# Patient Record
Sex: Female | Born: 1985 | Race: White | Hispanic: No | Marital: Single
Health system: Southern US, Community
[De-identification: ages and names within clinical notes are randomized; demographics above are authoritative.]

## PROBLEM LIST (undated history)

## (undated) DIAGNOSIS — F419 Anxiety disorder, unspecified: Secondary | ICD-10-CM

## (undated) HISTORY — PX: TONSILLECTOMY: SUR1361

---

## 2015-02-27 DIAGNOSIS — R630 Anorexia: Secondary | ICD-10-CM | POA: Insufficient documentation

## 2015-02-27 DIAGNOSIS — R112 Nausea with vomiting, unspecified: Secondary | ICD-10-CM | POA: Insufficient documentation

## 2015-02-27 DIAGNOSIS — F419 Anxiety disorder, unspecified: Secondary | ICD-10-CM | POA: Insufficient documentation

## 2015-02-28 ENCOUNTER — Emergency Department (HOSPITAL_BASED_OUTPATIENT_CLINIC_OR_DEPARTMENT_OTHER): Payer: Self-pay

## 2015-02-28 ENCOUNTER — Emergency Department (HOSPITAL_BASED_OUTPATIENT_CLINIC_OR_DEPARTMENT_OTHER)
Admission: EM | Admit: 2015-02-28 | Discharge: 2015-02-28 | Disposition: A | Discharge status: Home / Self Care | Payer: Self-pay | Attending: Emergency Medicine | Admitting: Emergency Medicine

## 2015-02-28 ENCOUNTER — Encounter (HOSPITAL_BASED_OUTPATIENT_CLINIC_OR_DEPARTMENT_OTHER): Payer: Self-pay | Admitting: Emergency Medicine

## 2015-02-28 DIAGNOSIS — R112 Nausea with vomiting, unspecified: Secondary | ICD-10-CM

## 2015-02-28 HISTORY — DX: Anxiety disorder, unspecified: F41.9

## 2015-02-28 LAB — CBC WITH DIFFERENTIAL/PLATELET
BASOS ABS: 0 10*3/uL (ref 0.0–0.1)
Basophils Relative: 0 % (ref 0–1)
EOS PCT: 0 % (ref 0–5)
Eosinophils Absolute: 0 10*3/uL (ref 0.0–0.7)
HEMATOCRIT: 37.4 % (ref 36.0–46.0)
Hemoglobin: 12.3 g/dL (ref 12.0–15.0)
LYMPHS ABS: 3.2 10*3/uL (ref 0.7–4.0)
Lymphocytes Relative: 29 % (ref 12–46)
MCH: 30.2 pg (ref 26.0–34.0)
MCHC: 32.9 g/dL (ref 30.0–36.0)
MCV: 91.9 fL (ref 78.0–100.0)
MONO ABS: 0.8 10*3/uL (ref 0.1–1.0)
MONOS PCT: 7 % (ref 3–12)
Neutro Abs: 7.2 10*3/uL (ref 1.7–7.7)
Neutrophils Relative %: 64 % (ref 43–77)
PLATELETS: 199 10*3/uL (ref 150–400)
RBC: 4.07 MIL/uL (ref 3.87–5.11)
RDW: 12.6 % (ref 11.5–15.5)
WBC: 11.3 10*3/uL — ABNORMAL HIGH (ref 4.0–10.5)

## 2015-02-28 LAB — COMPREHENSIVE METABOLIC PANEL
ALBUMIN: 4.6 g/dL (ref 3.5–5.2)
ALK PHOS: 47 U/L (ref 39–117)
ALT: 30 U/L (ref 0–35)
AST: 32 U/L (ref 0–37)
Anion gap: 12 (ref 5–15)
BUN: 10 mg/dL (ref 6–23)
CALCIUM: 9.2 mg/dL (ref 8.4–10.5)
CHLORIDE: 103 mmol/L (ref 96–112)
CO2: 24 mmol/L (ref 19–32)
CREATININE: 0.69 mg/dL (ref 0.50–1.10)
GLUCOSE: 93 mg/dL (ref 70–99)
Potassium: 2.9 mmol/L — ABNORMAL LOW (ref 3.5–5.1)
SODIUM: 139 mmol/L (ref 135–145)
Total Bilirubin: 0.5 mg/dL (ref 0.3–1.2)
Total Protein: 8 g/dL (ref 6.0–8.3)

## 2015-02-28 LAB — URINALYSIS, ROUTINE W REFLEX MICROSCOPIC
Glucose, UA: NEGATIVE mg/dL
Hgb urine dipstick: NEGATIVE
KETONES UR: 40 mg/dL — AB
LEUKOCYTES UA: NEGATIVE
Nitrite: NEGATIVE
Protein, ur: 100 mg/dL — AB
Specific Gravity, Urine: 1.036 — ABNORMAL HIGH (ref 1.005–1.030)
UROBILINOGEN UA: 1 mg/dL (ref 0.0–1.0)
pH: 5.5 (ref 5.0–8.0)

## 2015-02-28 LAB — URINE MICROSCOPIC-ADD ON

## 2015-02-28 LAB — PREGNANCY, URINE: Preg Test, Ur: NEGATIVE

## 2015-02-28 MED ORDER — POTASSIUM CHLORIDE CRYS ER 20 MEQ PO TBCR
40.0000 meq | EXTENDED_RELEASE_TABLET | Freq: Once | ORAL | Status: AC
  Administered 2015-02-28: 40 meq via ORAL
  Filled 2015-02-28: qty 2

## 2015-02-28 MED ORDER — ONDANSETRON HCL 4 MG/2ML IJ SOLN
4.0000 mg | Freq: Once | INTRAMUSCULAR | Status: AC
  Administered 2015-02-28: 4 mg via INTRAVENOUS
  Filled 2015-02-28: qty 2

## 2015-02-28 MED ORDER — DICYCLOMINE HCL 10 MG/ML IM SOLN
20.0000 mg | Freq: Once | INTRAMUSCULAR | Status: AC
Start: 2015-02-28 — End: 2015-02-28
  Administered 2015-02-28: 20 mg via INTRAMUSCULAR
  Filled 2015-02-28: qty 2

## 2015-02-28 MED ORDER — SODIUM CHLORIDE 0.9 % IV BOLUS (SEPSIS)
500.0000 mL | Freq: Once | INTRAVENOUS | Status: AC
  Administered 2015-02-28: 500 mL via INTRAVENOUS

## 2015-02-28 MED ORDER — GI COCKTAIL ~~LOC~~
30.0000 mL | Freq: Once | ORAL | Status: AC
  Administered 2015-02-28: 30 mL via ORAL
  Filled 2015-02-28: qty 30

## 2015-02-28 MED ORDER — OMEPRAZOLE 20 MG PO CPDR: 20.0000 mg | DELAYED_RELEASE_CAPSULE | Freq: Every day | ORAL | Status: AC

## 2015-02-28 MED ORDER — ONDANSETRON 8 MG PO TBDP: ORAL_TABLET | ORAL | Status: AC

## 2015-02-28 MED ORDER — METOCLOPRAMIDE HCL 5 MG/ML IJ SOLN
10.0000 mg | Freq: Once | INTRAMUSCULAR | Status: AC
  Administered 2015-02-28: 10 mg via INTRAVENOUS
  Filled 2015-02-28: qty 2

## 2015-02-28 NOTE — ED Provider Notes (Signed)
CSN: [id number]     Arrival date & time 02/27/15  2342 History   First MD Initiated Contact with Patient 02/28/15 0210     Chief Complaint  Patient presents with  . Abdominal Pain     (Consider location/radiation/quality/duration/timing/severity/associated sxs/prior Treatment) Patient is a 29 y.o. female presenting with vomiting. The history is provided by the patient.  Emesis Severity:  Moderate Timing:  Intermittent Quality:  Stomach contents Feeding tolerance: will not eat from fear. Progression:  Unchanged Chronicity:  New Recent urination:  Normal Context: not post-tussive   Relieved by:  Nothing Worsened by:  Nothing tried Ineffective treatments:  None tried Associated symptoms: no diarrhea and no sore throat   Associated symptoms comment:  Cramping in epigastrum Risk factors: no alcohol use     Past Medical History  Diagnosis Date  . Anxiety    Past Surgical History  Procedure Laterality Date  . Tonsillectomy     No family history on file. History  Substance Use Topics  . Smoking status: Not on file  . Smokeless tobacco: Not on file  . Alcohol Use: Not on file   OB History    No data available     Review of Systems  Constitutional: Negative for fever.  HENT: Negative for sore throat.   Gastrointestinal: Positive for vomiting. Negative for diarrhea and constipation.  Genitourinary: Negative for dysuria.  Psychiatric/Behavioral: The patient is nervous/anxious.   All other systems reviewed and are negative.     Allergies  Review of patient's allergies indicates no known allergies.  Home Medications   Prior to Admission medications   Not on File   BP 121/71 mmHg  Temp(Src) 98.3 F (36.8 C) (Oral)  Resp 18  Ht  (1.626 m)  Wt 130 lb (58.968 kg)  BMI 22.30 kg/m2  SpO2 100%  LMP 02/01/2015 (LMP Unknown) Physical Exam  Constitutional: She is oriented to person, place, and time. She appears well-developed and well-nourished. No distress.   HENT:  Head: Normocephalic and atraumatic.  Mouth/Throat: Oropharynx is clear and moist.  Eyes: Conjunctivae are normal. Pupils are equal, round, and reactive to light.  Neck: Normal range of motion. Neck supple.  Cardiovascular: Normal rate, regular rhythm and intact distal pulses.   Pulmonary/Chest: Effort normal and breath sounds normal. No respiratory distress. She has no wheezes.  Abdominal: Soft. Bowel sounds are increased. There is no tenderness. There is no rigidity, no rebound, no guarding, no tenderness at McBurney's point and negative Murphy's sign.  Musculoskeletal: Normal range of motion.  Neurological: She is alert and oriented to person, place, and time.  Skin: Skin is warm and dry.  Psychiatric: Her mood appears anxious.  tearful    ED Course  Procedures (including critical care time) Labs Review Labs Reviewed  URINALYSIS, ROUTINE W REFLEX MICROSCOPIC - Abnormal; Notable for the following:    Specific Gravity, Urine 1.036 (*)    Bilirubin Urine SMALL (*)    Ketones, ur 40 (*)    Protein, ur 100 (*)    All other components within normal limits  COMPREHENSIVE METABOLIC PANEL - Abnormal; Notable for the following:    Potassium 2.9 (*)    All other components within normal limits  CBC WITH DIFFERENTIAL/PLATELET - Abnormal; Notable for the following:    WBC 11.3 (*)    All other components within normal limits  URINE MICROSCOPIC-ADD ON - Abnormal; Notable for the following:    Squamous Epithelial / LPF FEW (*)    Bacteria, UA  FEW (*)    All other components within normal limits  PREGNANCY, URINE    Imaging Review Dg Abd Acute W/chest  02/28/2015   CLINICAL DATA:  Epigastric pain, nausea and vomiting.  EXAM: ACUTE ABDOMEN SERIES (ABDOMEN 2 VIEW & CHEST 1 VIEW)  COMPARISON:  None.  FINDINGS: There is no evidence of dilated bowel loops or free intraperitoneal air. No radiopaque calculi or other significant radiographic abnormality is seen. Heart size and mediastinal  contours are within normal limits. Both lungs are clear.  IMPRESSION: Negative abdominal radiographs.  No acute cardiopulmonary disease.   Electronically Signed   By: Ellery Plunkaniel R Mitchell M.D.   On: 02/28/2015 02:41     EKG Interpretation None      MDM   Final diagnoses:  None    Suspect anxiety and depression.  Will have follow up with PMD.  Strict return precautions given.      Cy BlamerApril Keath Matera, MD 02/28/15 [phone]

## 2015-02-28 NOTE — ED Notes (Signed)
Co abd pain and unable to eat x 5 days,  Denies other sx

## 2015-02-28 NOTE — Discharge Instructions (Signed)
°Emergency Department Resource Guide °1) Find a Doctor and Pay Out of Pocket °Although you won't have to find out who is covered by your insurance plan, it is a good idea to ask around and get recommendations. You will then need to call the office and see if the doctor you have chosen will accept you as a new patient and what types of options they offer for patients who are self-pay. Some doctors offer discounts or will set up payment plans for their patients who do not have insurance, but you will need to ask so you aren't surprised when you get to your appointment. ° °2) Contact Your Local Health Department °Not all health departments have doctors that can see patients for sick visits, but many do, so it is worth a call to see if yours does. If you don't know where your local health department is, you can check in your phone book. The CDC also has a tool to help you locate your state's health department, and many state websites also have listings of all of their local health departments. ° °3) Find a Walk-in Clinic °If your illness is not likely to be very severe or complicated, you may want to try a walk in clinic. These are popping up all over the country in pharmacies, drugstores, and shopping centers. They're usually staffed by nurse practitioners or physician assistants that have been trained to treat common illnesses and complaints. They're usually fairly quick and inexpensive. However, if you have serious medical issues or chronic medical problems, these are probably not your best option. ° °No Primary Care Doctor: °- Call [facility] at  832-8000 - they can help you locate a primary care doctor that  accepts your insurance, provides certain services, etc. °- Physician Referral Service- 1-800-533-3463 ° °Chronic Pain Problems: °Organization         Address  Phone   Notes  °[facility]  (336) 297-2271 Patients need to be referred by their primary care doctor.  ° °Medication  Assistance: °Organization         Address  Phone   Notes  °[place] [facility] [address] °[place], [place] [address] (336) 641-8030 --Must be a resident of [place] °-- Must have NO insurance coverage whatsoever (no Medicaid/ Medicare, etc.) °-- The pt. MUST have a primary care doctor that directs their care regularly and follows them in the community °  °[facility]  (866) 331-1348   °[employer]  (888) 892-1162   ° °Agencies that provide inexpensive medical care: °Organization         Address  Phone   Notes  °[facility]  (336) 832-8035   ° Internal Medicine    (336) 832-7272   °[facility] [address] °[place], [place] [address] (336) 832-4777   °[facility] [address], °[place] (336) 271-4999   °[facility]    (336) 373-0678   °[facility]    (336) 272-1050   °[facility] ° [address], [place] Phone:  (336) 832-4444, Fax:  (336) 832-4440 Hours of Operation:  9 am - 6 pm, M-F.  Also accepts Medicaid/Medicare and self-pay.  °[facility] ° [address], [place] Phone: (336) 832-3150, Fax: (336) [phone]. Hours of Operation:  8:30 am - 5:30 pm, M-F.  Also accepts Medicaid and self-pay.  °[facility] [place] [address], [place] Phone: (336) 878-6027   °[facility] [address], [place], [place] (336)723-1848, Ext. 123 Mondays & Thursdays: 7-9 AM.  First 15 patients are seen on a first come, first serve basis. °  ° °Medicaid-accepting [place] Providers: ° °Organization         Address  Phone   Notes  °[facility] [address], [place] (336) 641-2100 Also accepts self-pay patients.  °[facility] [address], [place] ° (336) 856-9996   °[facility] [address], [place]  (336) 288-8857   °[facility] [address] [place] [address], [place] (336) 299-7000   °Veita Bland [address], [place]  ° (336) 373-1557 Only accepts [place] Access Medicaid patients after they have their name applied to their card.  ° °Self-Pay (no insurance) in [place]: ° °Organization         Address  Phone   Notes  °Sickle Cell Patients, [facility] [address], [place] (336) 832-1970   °[facility] [address], [place] (336) 832-4400   °[facility] ° [address], [place] (336) 992-4800   °[facility]/Dr. Osei-Bonsu ° [address] [place] [address], [place] or [address], [place] (336) [phone] Phone number for both [place] and [place] locations is the same.  °[facility] [address], [place] (336) 299-0000   °[facility] [address] [place] [address], [place] or [address] (336) 852-7530 °(336) 878-2260   °[facility] [address], [place] (336) 350-1642, phone; (336) 294-5005, fax Sees patients 1st and 3rd Saturday of every month.  Must not qualify for public or private insurance (i.e. Medicaid, Medicare, [place] Health Choice, Veterans' Benefits) • Household income should be no more than 200% of the poverty level •The clinic cannot treat you if you are pregnant or think you are pregnant • Sexually transmitted diseases are not treated at the clinic.  ° ° °Dental Care: °Organization         Address  Phone  Notes  °[place] [facility] [address], [place] (336) 641-6152 Accepts children up to age 21 who are enrolled in Medicaid or [place] Health Choice; pregnant women with a Medicaid card; and children who have applied for Medicaid or [place] Health Choice, but were declined, whose parents can pay a reduced fee at time of service.  °[place]  [facility] [place]  [address], [place] (336) 641-7733 Accepts children up to age 21 who are enrolled in Medicaid or [place] Health Choice; pregnant women with a Medicaid card; and children who have applied for Medicaid or [place] Health Choice, but were declined, whose parents can pay a reduced fee at time of service.  °[facility] ° [address],  (336) 641-4533 Patients are seen by appointment only. Walk-ins are not accepted. [facility] will see patients 18 years of age and older. °Monday - Tuesday (8am-5pm) °Most Wednesdays (8:30-5pm) °$30 per visit, cash only  °[facility] ° [address], [place] (336) 641-4533 Patients are seen by appointment only. Walk-ins are not accepted. [facility] will see patients 18 years of age and older. °One   Wednesday Evening (Monthly: Volunteer Based).  $30 per visit, cash only  °[facility]  (919) 537-3737 for adults; Children under age 4, call Graduate Pediatric Dentistry at ([phone]) [phone]. Children aged 4-14, please call (919) [phone] to request a pediatric application. ° Dental services are provided in all areas of dental care including fillings, crowns and bridges, complete and partial dentures, implants, gum treatment, root canals, and extractions. Preventive care is also provided. Treatment is provided to both adults and children. °Patients are selected via a lottery and there is often a waiting list. °  °[facility] [address], °[place] ° (336) 763-8833 [email or web address] °  °[facility] [address], [place], [place] (336)723-1848, Ext. 123 Second and Fourth Thursday of each month, opens at 6:30 AM; Clinic ends at 9 AM.  Patients are seen on a first-come first-served basis, and a limited number are seen during each clinic.  ° °[facility] ° [address], [place], [place] (336) 723-7904    Eligibility Requirements °You must have lived in [place], [place], or [place] counties for at least the last three months. °  You cannot be eligible for state or federal sponsored healthcare insurance, including Veterans Administration, Medicaid, or Medicare. °  You generally cannot be eligible for healthcare insurance through your employer.  °  How to apply: °Eligibility screenings are held every Tuesday and Wednesday afternoon from 1:00 pm until 4:00 pm. You do not need an appointment for the interview!  °[facility] [address], [place], [place] 336-631-2330   °[facility]  336-342-8273   °[facility]  336-703-3100   °[facility]  336-570-6415   ° °Behavioral Health Resources in the Community: °Intensive Outpatient Programs °Organization         Address  Phone  Notes  °[facility] [address], [place], [place] 336-878-6098   °[facility] [address], [place], [place] 336-832-9800   °[facility] [address], [place], [place] ° 336-882-2125   °[facility] [address],  °[place], [place] 1-800-853-5163 or 336-641-4981   °Substance Abuse Resources °Organization         Address  Phone  Notes  °[facility]  336-882-2125   °[facility]  336-784-9470   °[facility]  336-285-9073   °[facility]  336-845-3988   °Residential & Outpatient Substance Abuse Program  1-800-659-3381   °Psychological Services °Organization         Address  Phone  Notes  °[facility]  336- 832-9600   °[facility]  336- 378-7881   °[facility] [address], [place] 1-800-853-5163 or 336-641-4981   ° °Mobile Crisis Teams °Organization         Address  Phone  Notes  °[facility], Mobile Crisis Care Unit  1-877-626-1772   °Assertive °[facility] ° [address]  [place], [place] 336-834-9664   °Sharon DeEsch [address] °  336-554-5454   ° °Self-Help/Support Groups °Organization         Address  Phone             Notes  °[facility]  - variety of support groups  336- 373-1402 Call for more information  °Narcotics Anonymous (NA), [facility] [address], °[place]   2 meetings at this location  ° °  Residential Treatment Programs °Organization         Address  Phone  Notes  °[facility] [address],    °[place]  1-866-801-8205   °[facility] ° [address], [place], [place] 704-293-8524   °[facility] [address], [place] 336-845-3988 Admissions: 8am-3pm M-F  °[facility] [address]    °[place], [place] 336-841-1104   °[facility] [address], [place], [place] 336-379-7146   °[facility] [address]  °[place], [place] 336-285-9073   °[facility] - Intensive Outpatient [address], [place], [place] 336-852-3033   °[facility] [address]  °[place], [place] 1-877-615-2722 or 336-784-9470   °[facility] [address] [place], [place] 336-227-7417 Accepts Medicaid  °[facility] [address]  °[place] 1-800-659-3381 Substance Abuse/Addiction Treatment  ° °[place] Behavioral Health Resources °Organization         Address  Phone  Notes  °[facility]  (888) 581-9988   °Julie Brannon, PhD [address] [place], [place]   (336) 349-5553 or (336) 951-0000   °[facility]   [address] °[place], [place] (336) 349-4454   °[facility] [address], [place], [place] (336) 342-8316 Insurance/Medicaid/sponsorship through [facility]  °[facility] [address]                                    [place], [place] (336) 342-8316 Therapy/tele-psych/case    °[facility] [address]  ° [place], [place] (336) 349-2233    °Dr. Arfeen  (336) 349-4544   °[facility] [place]  [employer] [place] [facility] 1) [address], [place] °2) [address], [place] °3)  [address], [place] (336) 349-3220 °(336) 342-7768 ° °(336) 342-8140   °[place] [facility] (336) 342-1394 or (336) 342-3537 (After Hours)    ° ° °

## 2015-02-28 NOTE — ED Notes (Signed)
Pt sts unable to eat food in last 5 days; pain in epigastric area; +nausea and vomiting; denies diarrhea; denies fever; +chills; denies urinary complaints; LMP: due now, recently stopped BC pills

## 2015-08-26 IMAGING — CR DG ABDOMEN ACUTE W/ 1V CHEST
3 series · 3 of 3 positions shown · non-contrast
Comparison: None.

CLINICAL DATA: Epigastric pain, nausea and vomiting.

EXAM:
ACUTE ABDOMEN SERIES (ABDOMEN 2 VIEW & CHEST 1 VIEW)

[w chest pa]
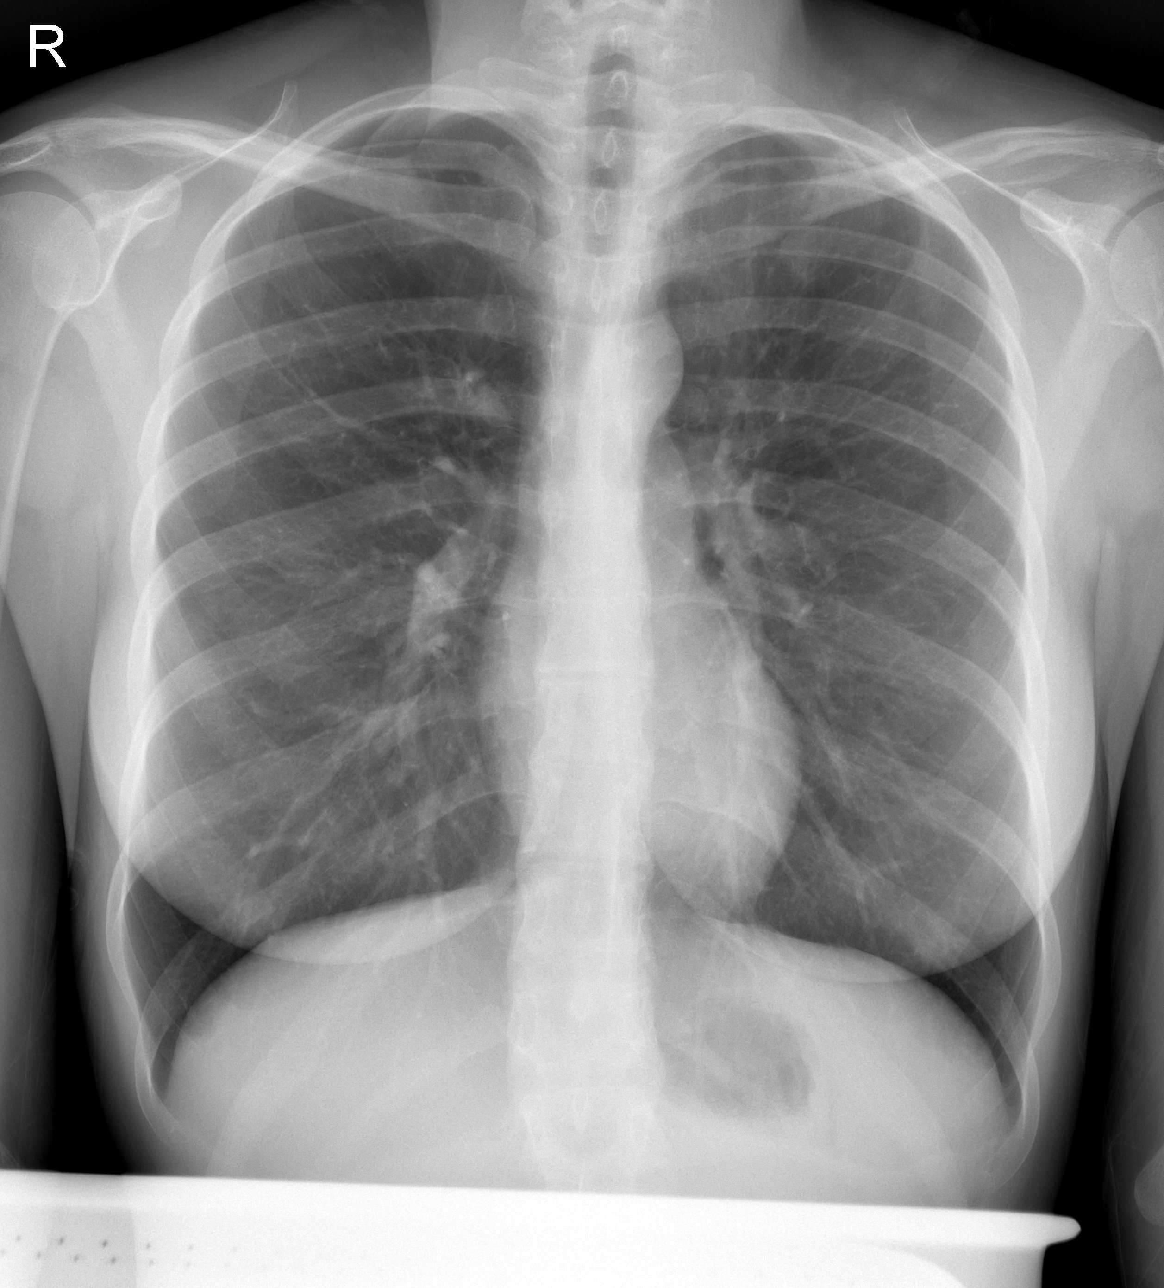

[w abdomen upright]
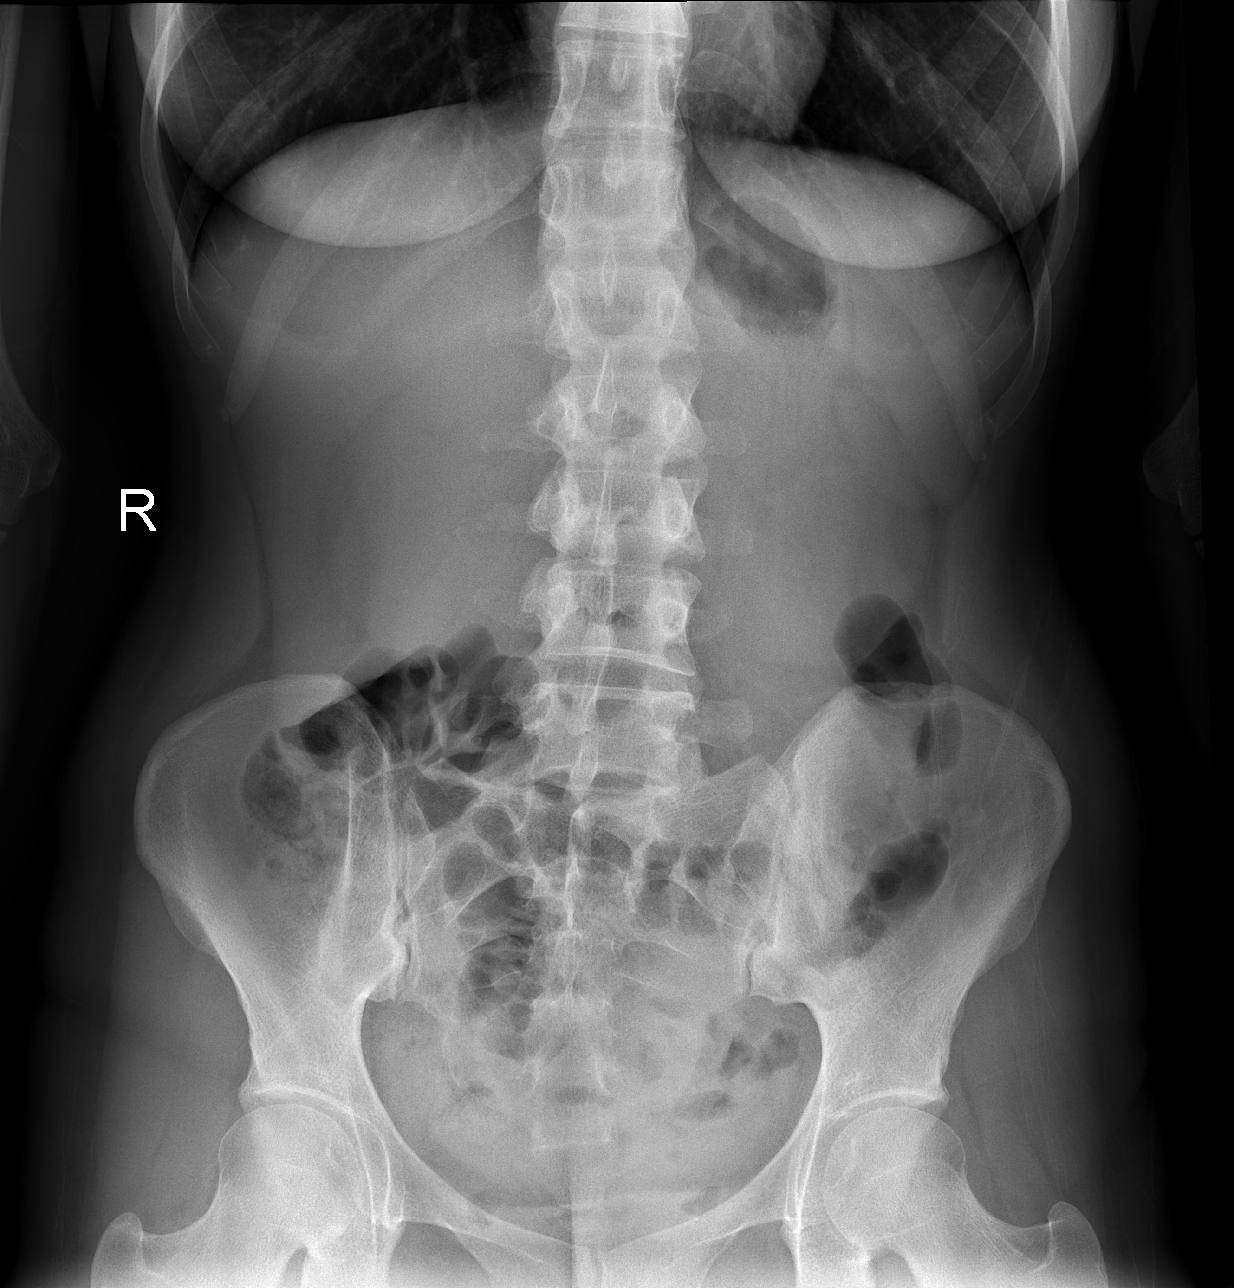

[t abdomen supine]
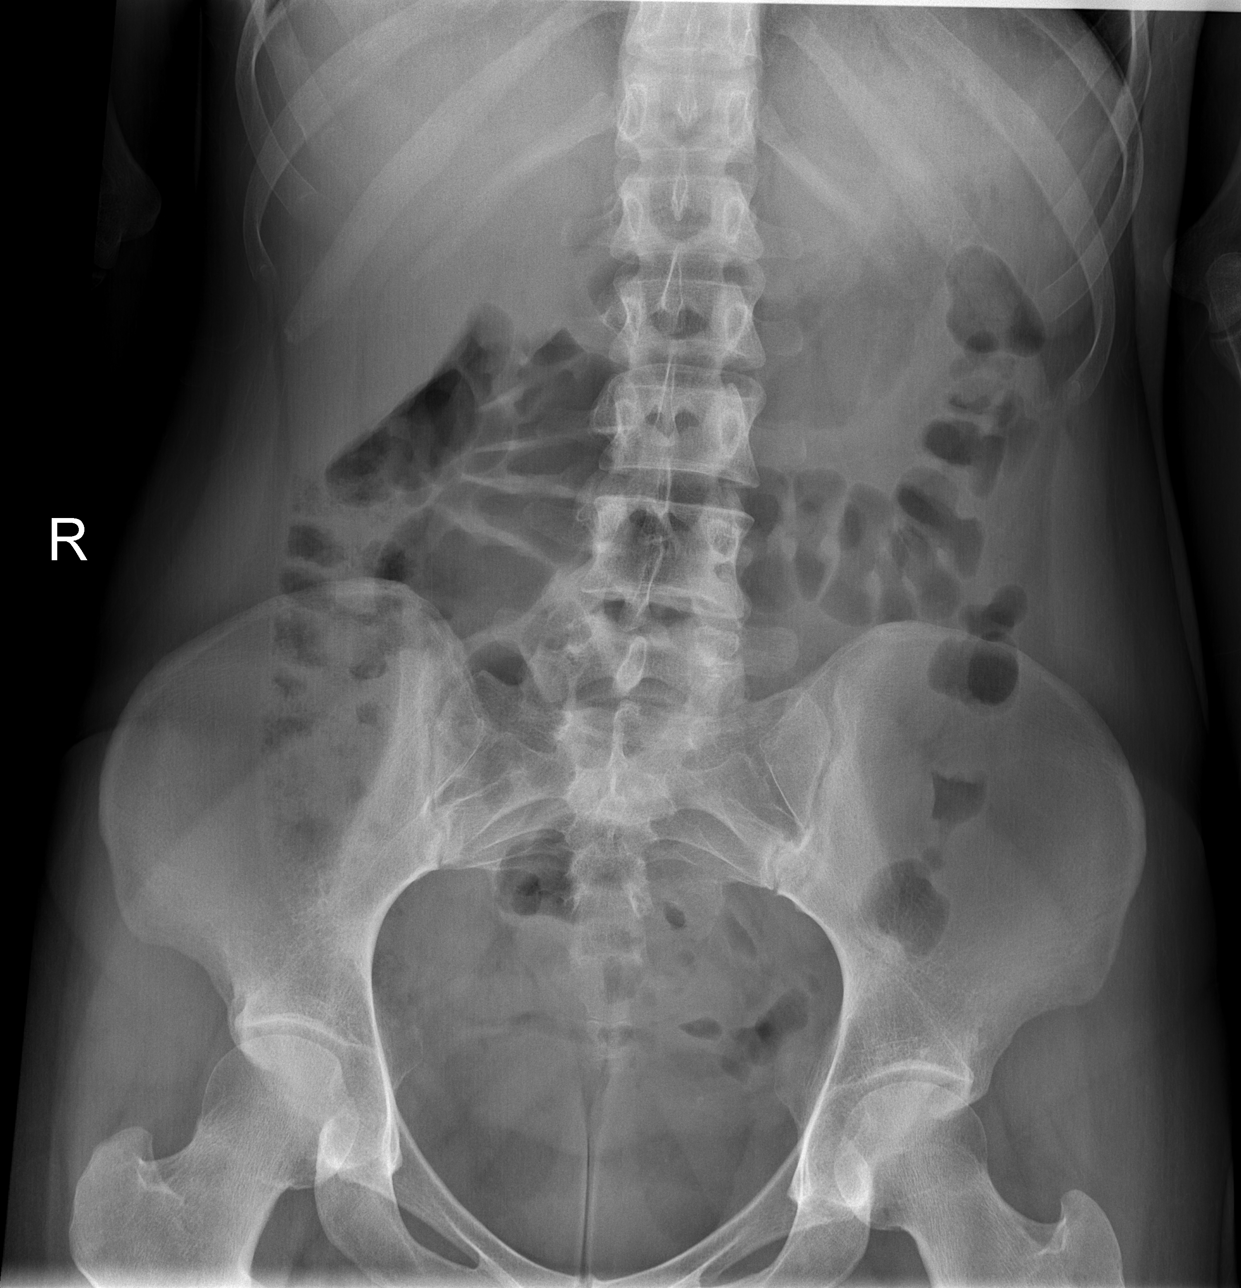

[3 of 3 positions shown; findings below may reference images not displayed]

FINDINGS: There is no evidence of dilated bowel loops or free intraperitoneal
air. No radiopaque calculi or other significant radiographic
abnormality is seen. Heart size and mediastinal contours are within
normal limits. Both lungs are clear.
IMPRESSION: Negative abdominal radiographs.  No acute cardiopulmonary disease.
